# Patient Record
Sex: Female | Born: 1935 | Race: Black or African American | Hispanic: No
Health system: Southern US, Community
[De-identification: ages and names within clinical notes are randomized; demographics above are authoritative.]

## PROBLEM LIST (undated history)

## (undated) DIAGNOSIS — E119 Type 2 diabetes mellitus without complications: Secondary | ICD-10-CM

## (undated) DIAGNOSIS — I10 Essential (primary) hypertension: Secondary | ICD-10-CM

## (undated) HISTORY — PX: CARPAL TUNNEL RELEASE: SHX101

## (undated) HISTORY — PX: TRIGGER FINGER RELEASE: SHX641

## (undated) HISTORY — PX: ABDOMINAL HYSTERECTOMY: SHX81

## (undated) HISTORY — PX: JOINT REPLACEMENT: SHX530

## (undated) HISTORY — PX: APPENDECTOMY: SHX54

## (undated) HISTORY — PX: TONSILLECTOMY: SUR1361

---

## 2020-04-07 ENCOUNTER — Other Ambulatory Visit: Payer: Self-pay

## 2020-04-07 ENCOUNTER — Emergency Department (HOSPITAL_BASED_OUTPATIENT_CLINIC_OR_DEPARTMENT_OTHER)
Admission: EM | Admit: 2020-04-07 | Discharge: 2020-04-07 | Disposition: A | Discharge status: Home / Self Care | Payer: Medicare HMO | Attending: Emergency Medicine | Admitting: Emergency Medicine

## 2020-04-07 ENCOUNTER — Emergency Department (HOSPITAL_BASED_OUTPATIENT_CLINIC_OR_DEPARTMENT_OTHER): Payer: Medicare HMO

## 2020-04-07 ENCOUNTER — Encounter (HOSPITAL_BASED_OUTPATIENT_CLINIC_OR_DEPARTMENT_OTHER): Payer: Self-pay | Admitting: *Deleted

## 2020-04-07 DIAGNOSIS — E119 Type 2 diabetes mellitus without complications: Secondary | ICD-10-CM | POA: Diagnosis not present

## 2020-04-07 DIAGNOSIS — I10 Essential (primary) hypertension: Secondary | ICD-10-CM | POA: Insufficient documentation

## 2020-04-07 DIAGNOSIS — S0992XA Unspecified injury of nose, initial encounter: Secondary | ICD-10-CM | POA: Diagnosis present

## 2020-04-07 DIAGNOSIS — W101XXA Fall (on)(from) sidewalk curb, initial encounter: Secondary | ICD-10-CM | POA: Diagnosis not present

## 2020-04-07 DIAGNOSIS — W19XXXA Unspecified fall, initial encounter: Secondary | ICD-10-CM

## 2020-04-07 DIAGNOSIS — S022XXA Fracture of nasal bones, initial encounter for closed fracture: Secondary | ICD-10-CM | POA: Insufficient documentation

## 2020-04-07 HISTORY — DX: Type 2 diabetes mellitus without complications: E11.9

## 2020-04-07 HISTORY — DX: Essential (primary) hypertension: I10

## 2020-04-07 MED ORDER — BACITRACIN ZINC 500 UNIT/GM EX OINT: 1.0000 "application " | TOPICAL_OINTMENT | Freq: Two times a day (BID) | CUTANEOUS | 0 refills | Status: AC

## 2020-04-07 MED ORDER — HYDROCODONE-ACETAMINOPHEN 5-325 MG PO TABS: 1.0000 | ORAL_TABLET | ORAL | 0 refills | Status: AC | PRN

## 2020-04-07 MED ORDER — ACETAMINOPHEN 500 MG PO TABS: 1000.0000 mg | ORAL_TABLET | Freq: Once | ORAL | Status: AC

## 2020-04-07 MED ORDER — BACITRACIN ZINC 500 UNIT/GM EX OINT: TOPICAL_OINTMENT | Freq: Two times a day (BID) | CUTANEOUS | Status: DC

## 2020-04-07 MED ORDER — ACETAMINOPHEN 500 MG PO TABS
ORAL_TABLET | ORAL | Status: AC
  Administered 2020-04-07: 1000 mg via ORAL
  Filled 2020-04-07: qty 2

## 2020-04-07 NOTE — ED Notes (Signed)
ED Provider at bedside. 

## 2020-04-07 NOTE — Discharge Instructions (Signed)
Ice the face and knee  Place ointment to the abrasions  Follow up with ENT for the nasal fracture and with Orthopedist for the knee  Return for new or worsening symptoms

## 2020-04-07 NOTE — ED Notes (Signed)
bacitracin applied to all wounds 3 facial and one hand wound

## 2020-04-07 NOTE — ED Notes (Signed)
Off unit for CT scan.  Son at bedside.

## 2020-04-07 NOTE — ED Triage Notes (Signed)
C/o fall from standing x 3 hrs ago , sent here from [facility] for ct face and head, denies LOC

## 2020-04-07 NOTE — ED Provider Notes (Signed)
[facility] EMERGENCY DEPARTMENT Provider Note   CSN: [id number] Arrival date & time: 04/07/20  1754     History Chief Complaint  Patient presents with  . Fall    Maria Hutchinson is a 86 y.o. female with past medical history significant for hypertension, diabetes who presents for evaluation of mechanical fall.  Patient was walking when she tripped and fell over a curb.  She hit her face on the ground as well as her left knee.  She has been ambulatory since the incident.  Had a nosebleed from her right nares which has since stopped.  She denies any anticoagulation, LOC.  States she has generalized facial pain, primarily over her right forehead where she has a hematoma.  She denies any midline neck pain, back pain or pelvic pain.  She denies any lightheadedness, blurred vision, chest pain, shortness of breath abdominal pain, diarrhea, dysuria.  Does have anterior left knee pain with some soft tissue swelling.  Her last tetanus is less than 5 years ago.  Has not had any episodes of emesis.  No preceding sudden onset thunderclap headache, chest pain, shortness of breath, dizziness.  Denies aggravating relieving factors.  Was seen by urgent care sent here for CT imaging.  History obtained from patient, family room and past medical records.  No interpreter is used.   HPI     Past Medical History:  Diagnosis Date  . Diabetes mellitus without complication (HCC)   . Hypertension     There are no problems to display for this patient.   Past Surgical History:  Procedure Laterality Date  . ABDOMINAL HYSTERECTOMY    . APPENDECTOMY    . CARPAL TUNNEL RELEASE    . JOINT REPLACEMENT    . TONSILLECTOMY    . TRIGGER FINGER RELEASE       OB History   No obstetric history on file.     No family history on file.  Social History   Tobacco Use  . Smoking status: Not on file  Substance Use Topics  . Alcohol use: Not on file  . Drug use: Not on file    Home Medications Prior to  Admission medications   Medication Sig Start Date End Date Taking? Authorizing Provider  bacitracin ointment Apply 1 application topically 2 (two) times daily. 04/07/20   Opal Mckellips A, PA-C  HYDROcodone-acetaminophen (NORCO/VICODIN) 5-325 MG tablet Take 1 tablet by mouth every 4 (four) hours as needed. 04/07/20   Silvano Garofano A, PA-C    Allergies    Codeine, Ivp dye [iodinated diagnostic agents], and Shellfish allergy  Review of Systems   Review of Systems  Constitutional: Negative.   HENT: Negative.  Negative for facial swelling.   Respiratory: Negative.   Cardiovascular: Negative.   Gastrointestinal: Negative.   Genitourinary: Negative.   Musculoskeletal: Positive for neck pain. Negative for neck stiffness.       Left knee pain  Skin: Negative.   Neurological: Negative.   All other systems reviewed and are negative.   Physical Exam Updated Vital Signs BP (!) 161/93 (BP Location: Right Arm)   Pulse 60   Temp 97.7 F (36.5 C) (Oral)   Resp 19   Ht  (1.626 m)   Wt 78.5 kg   SpO2 99%   BMI 29.70 kg/m   Physical Exam Vitals and nursing note reviewed.  Constitutional:      General: She is not in acute distress.    Appearance: She is well-developed. She is  obese. She is not ill-appearing or toxic-appearing.  HENT:     Head: Normocephalic. Abrasion and contusion present. No raccoon eyes, Battle's sign or laceration.     Jaw: There is normal jaw occlusion.      Comments: Abrasion to distal nasal bridge as well as right upper lip.  No lacerations to suture.  Moderate hematoma to right forehead.  No battle sign, raccoon eyes. 4 finger jaw opening without pain.    Right Ear: No hemotympanum.     Left Ear: No hemotympanum.     Nose:     Right Nostril: Epistaxis present. No foreign body or septal hematoma.     Left Nostril: Epistaxis present. No foreign body or septal hematoma.      Comments: No septal hematoma, mild tenderness over nasal bridge.  Epistaxis with  dried blood to bilateral nares.  No active epistaxis    Mouth/Throat:     Lips: Pink.     Mouth: Mucous membranes are moist.     Dentition: No dental tenderness.     Pharynx: Oropharynx is clear. Uvula midline.     Comments: No drooling, dysphagia or trismus.  No loose dentition.  Uvula midline Eyes:     Extraocular Movements: Extraocular movements intact.     Conjunctiva/sclera: Conjunctivae normal.     Pupils: Pupils are equal, round, and reactive to light.     Visual Fields: Right eye visual fields normal and left eye visual fields normal.     Comments: EOMs intact.  PERRLA  Neck:     Trachea: Trachea and phonation normal.     Comments: Full range of motion without difficulty. Cardiovascular:     Rate and Rhythm: Normal rate.     Pulses: Normal pulses.          Radial pulses are 2+ on the right side and 2+ on the left side.       Dorsalis pedis pulses are 2+ on the right side and 2+ on the left side.     Heart sounds: Normal heart sounds.  Pulmonary:     Effort: Pulmonary effort is normal. No respiratory distress.     Breath sounds: Normal breath sounds and air entry.     Comments: Speaks in full sentences without difficulty Chest:     Comments: No crepitus, step-offs.  No tenderness to anterior posterior chest wall including ribs Abdominal:     General: Bowel sounds are normal. There is no distension.     Palpations: Abdomen is soft.     Tenderness: There is no abdominal tenderness. There is no right CVA tenderness, left CVA tenderness, guarding or rebound.     Hernia: No hernia is present.     Comments: Soft, nontender without rebound or guarding.  Pelvis stable, nontender palpation  Musculoskeletal:        General: Normal range of motion.     Cervical back: Full passive range of motion without pain and normal range of motion.     Comments: Pelvis stable, nontender palpation.  No midline spinal tenderness, crepitus or step-offs.  No bony tenderness to bilateral upper  extremities.  Raise his hand above head without difficulty.  Tenderness to anterior patella left lower extremity.  Able to flex and extend without difficulty.  Some mild soft tissue swelling.  Negative varus, valgus stress, negative anterior drawer.  No bony tenderness of bilateral femur, tibia or fibula.  Wiggles toes without difficulty.  Skin:    General: Skin is warm and dry.  Capillary Refill: Capillary refill takes less than 2 seconds.     Comments: Contusions and abrasions to face.  No lacerations to suture.  No active bleeding  Neurological:     General: No focal deficit present.     Mental Status: She is alert.     Cranial Nerves: Cranial nerves are intact.     Sensory: Sensation is intact.     Motor: Motor function is intact.     Coordination: Coordination is intact.     Gait: Gait is intact.     Comments: CN 2-12 grossly intact. Intact sensation Ambulatory without difficulty     ED Results / Procedures / Treatments   Labs (all labs ordered are listed, but only abnormal results are displayed) Labs Reviewed - No data to display  EKG None  Radiology CT Head Wo Contrast  Result Date: 04/07/2020 CLINICAL DATA:  Fall from standing height EXAM: CT HEAD WITHOUT CONTRAST CT MAXILLOFACIAL WITHOUT CONTRAST CT CERVICAL SPINE WITHOUT CONTRAST TECHNIQUE: Multidetector CT imaging of the head, cervical spine, and maxillofacial structures were performed using the standard protocol without intravenous contrast. Multiplanar CT image reconstructions of the cervical spine and maxillofacial structures were also generated. COMPARISON:  None FINDINGS: CT HEAD FINDINGS Brain: No evidence of acute infarction, hemorrhage, hydrocephalus, extra-axial collection, visible mass lesion or mass effect. Symmetric prominence of the ventricles, cisterns and sulci compatible with parenchymal volume loss. Patchy areas of white matter hypoattenuation are most compatible with chronic microvascular angiopathy.  Vascular: Atherosclerotic calcification of the carotid siphons. No hyperdense vessel. Skull: Right frontal scalp swelling and crescentic scalp hematoma measuring up to 6 mm in maximal thickness (3/27). No subjacent calvarial fracture or acute osseous injuries. Suspect a small tricholemmal cyst/dermal inclusion cyst in the high right posterior frontal scalp as well. Other: None. CT MAXILLOFACIAL FINDINGS Osseous: Soft tissue swelling extending across the bridge of the nose with impacted fractures of the bilateral nasal bones extending into the left frontal process of the maxilla and with associated fracture of the anterior bony nasal septum with slight leftward deviation. No fracture of the bony orbits. No other mid face fractures are seen. The pterygoid plates are intact. No visible or suspected temporal bone fractures. Temporomandibular joints are normally aligned. 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 Small radiodensities are seen anterior to the mandibular condyles which appears likely degenerative on the right though more indeterminate given a crescentic appearance on the left. Correlate for point tenderness. Mandible is otherwise intact. No fractured or avulsed teeth. Chronically absent maxillary and mandibular molar dentition bilaterally. Orbits: Soft tissue swelling predominantly across the medial orbital soft tissues extending from the nasal bridge and the right supraorbital soft tissues with some asymmetric, right greater than left palpebral thickening. No retro septal gas, stranding or hemorrhage. Prior bilateral lens extractions and senescent scleral plaques are noted. The globes appear otherwise normal and symmetric. Symmetric appearance of the extraocular musculature and optic nerve sheath complexes. Normal caliber of the superior ophthalmic veins. Sinuses: Thickening and pneumatized fluid in the anterior nasal passages likely some small amount of hemorrhage. Paranasal sinuses and mastoids are  otherwise predominantly clear. Degenerate bone Soft tissues: Soft tissue swelling across the frontal scalp, supraorbital and right superomedial soft tissues with additional soft tissue swelling superficial to the comminuted fractures of the nasal bridge and medial orbits bilaterally as well as bilaterally into the medial malar soft tissues. No other significant sites of swelling or hematoma. No soft tissue gas or foreign body. CT CERVICAL SPINE FINDINGS Alignment: Straightening  of the normal cervical lordosis with some focal reversal at C5-6 possibly accentuated by mild cervical flexion noted on the scout view. A stabilization collar is not present at the time of examination. No significant spondylolisthesis is evident. No evidence of traumatic listhesis. No abnormally widened, perched or jumped facets. Normal alignment of the craniocervical and atlantoaxial articulations. Skull base and vertebrae: The osseous structures appear diffusely demineralized which may limit detection of small or nondisplaced fractures. No acute skull base fracture. No vertebral body fracture or height loss. Moderate arthrosis at the atlantodental and basion dens intervals with periarticular spurring. Multilevel spondylitic changes as detailed below. Schmorl's node formation and C6 inferior endplate with vacuum phenomenon. No worrisome osseous lesions. Soft tissues and spinal canal: No pre or paravertebral fluid or swelling. No visible canal hematoma. Cervical carotid atherosclerosis. Disc levels: Multilevel intervertebral disc height loss with spondylitic endplate changes. Features most pronounced C5-6 and C6-7 with slightly larger disc osteophyte complexes at these levels which partially efface the ventral thecal sac but without significant resulting canal stenosis. Uncinate spurring and facet hypertrophic changes are present throughout the cervical spine resulting in mild multilevel neural foraminal narrowing with more moderate narrowing  bilaterally C5-6. No severe foraminal impingement. Upper chest: No acute abnormality in the upper chest or imaged lung apices. Calcification in the proximal great vessels and aortic arch. Other: No concerning thyroid nodules or masses. IMPRESSION: 1. No acute intracranial abnormality. 2. Right frontal scalp swelling and crescentic scalp hematoma measuring up to 6 mm in maximal thickness. No subjacent calvarial fracture or acute osseous injuries. 3. Mild parenchymal volume loss and chronic microvascular angiopathy. 4. Acute impacted fractures of the bilateral nasal bones extending into the left frontal process of the maxilla and with associated fracture of the anterior bony nasal septum with slight leftward deviation. Some associated soft tissue thickening and likely hemorrhage within the anterior nasal passages. 5. Periorbital soft tissue swelling bilaterally including more pronounced right supraorbital swelling with some asymmetric, right greater than left palpebral thickening. Additional thickening and palpebral soft tissues medially as well. No retro septal gas, stranding or hemorrhage. No associated fracture of the bony orbits. No globe injury. 6. Small radiodensities anterior to the mandibular condyles bilaterally which appear likely degenerative on the right though more indeterminate given a crescentic appearance on the left. Correlate for point tenderness. No TMJ dislocation or other mandibular fracture. 7. No evidence of acute fracture or traumatic listhesis of the cervical spine. 8. Cervical spondylitic changes as above. 9. Cervical and intracranial atherosclerosis. 10. Aortic Atherosclerosis (ICD10-I70.0). Electronically Signed   By: Kreg Shropshire M.D.   On: 04/07/2020 19:20   CT Cervical Spine Wo Contrast  Result Date: 04/07/2020 CLINICAL DATA:  Fall from standing height EXAM: CT HEAD WITHOUT CONTRAST CT MAXILLOFACIAL WITHOUT CONTRAST CT CERVICAL SPINE WITHOUT CONTRAST TECHNIQUE: Multidetector CT  imaging of the head, cervical spine, and maxillofacial structures were performed using the standard protocol without intravenous contrast. Multiplanar CT image reconstructions of the cervical spine and maxillofacial structures were also generated. COMPARISON:  None FINDINGS: CT HEAD FINDINGS Brain: No evidence of acute infarction, hemorrhage, hydrocephalus, extra-axial collection, visible mass lesion or mass effect. Symmetric prominence of the ventricles, cisterns and sulci compatible with parenchymal volume loss. Patchy areas of white matter hypoattenuation are most compatible with chronic microvascular angiopathy. Vascular: Atherosclerotic calcification of the carotid siphons. No hyperdense vessel. Skull: Right frontal scalp swelling and crescentic scalp hematoma measuring up to 6 mm in maximal thickness (3/27). No subjacent calvarial fracture or acute  osseous injuries. Suspect a small tricholemmal cyst/dermal inclusion cyst in the high right posterior frontal scalp as well. Other: None. CT MAXILLOFACIAL FINDINGS Osseous: Soft tissue swelling extending across the bridge of the nose with impacted fractures of the bilateral nasal bones extending into the left frontal process of the maxilla and with associated fracture of the anterior bony nasal septum with slight leftward deviation. No fracture of the bony orbits. No other mid face fractures are seen. The pterygoid plates are intact. No visible or suspected temporal bone fractures. Temporomandibular joints are normally aligned. 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 Small radiodensities are seen anterior to the mandibular condyles which appears likely degenerative on the right though more indeterminate given a crescentic appearance on the left. Correlate for point tenderness. Mandible is otherwise intact. No fractured or avulsed teeth. Chronically absent maxillary and mandibular molar dentition bilaterally. Orbits: Soft tissue swelling predominantly across the  medial orbital soft tissues extending from the nasal bridge and the right supraorbital soft tissues with some asymmetric, right greater than left palpebral thickening. No retro septal gas, stranding or hemorrhage. Prior bilateral lens extractions and senescent scleral plaques are noted. The globes appear otherwise normal and symmetric. Symmetric appearance of the extraocular musculature and optic nerve sheath complexes. Normal caliber of the superior ophthalmic veins. Sinuses: Thickening and pneumatized fluid in the anterior nasal passages likely some small amount of hemorrhage. Paranasal sinuses and mastoids are otherwise predominantly clear. Degenerate bone Soft tissues: Soft tissue swelling across the frontal scalp, supraorbital and right superomedial soft tissues with additional soft tissue swelling superficial to the comminuted fractures of the nasal bridge and medial orbits bilaterally as well as bilaterally into the medial malar soft tissues. No other significant sites of swelling or hematoma. No soft tissue gas or foreign body. CT CERVICAL SPINE FINDINGS Alignment: Straightening of the normal cervical lordosis with some focal reversal at C5-6 possibly accentuated by mild cervical flexion noted on the scout view. A stabilization collar is not present at the time of examination. No significant spondylolisthesis is evident. No evidence of traumatic listhesis. No abnormally widened, perched or jumped facets. Normal alignment of the craniocervical and atlantoaxial articulations. Skull base and vertebrae: The osseous structures appear diffusely demineralized which may limit detection of small or nondisplaced fractures. No acute skull base fracture. No vertebral body fracture or height loss. Moderate arthrosis at the atlantodental and basion dens intervals with periarticular spurring. Multilevel spondylitic changes as detailed below. Schmorl's node formation and C6 inferior endplate with vacuum phenomenon. No  worrisome osseous lesions. Soft tissues and spinal canal: No pre or paravertebral fluid or swelling. No visible canal hematoma. Cervical carotid atherosclerosis. Disc levels: Multilevel intervertebral disc height loss with spondylitic endplate changes. Features most pronounced C5-6 and C6-7 with slightly larger disc osteophyte complexes at these levels which partially efface the ventral thecal sac but without significant resulting canal stenosis. Uncinate spurring and facet hypertrophic changes are present throughout the cervical spine resulting in mild multilevel neural foraminal narrowing with more moderate narrowing bilaterally C5-6. No severe foraminal impingement. Upper chest: No acute abnormality in the upper chest or imaged lung apices. Calcification in the proximal great vessels and aortic arch. Other: No concerning thyroid nodules or masses. IMPRESSION: 1. No acute intracranial abnormality. 2. Right frontal scalp swelling and crescentic scalp hematoma measuring up to 6 mm in maximal thickness. No subjacent calvarial fracture or acute osseous injuries. 3. Mild parenchymal volume loss and chronic microvascular angiopathy. 4. Acute impacted fractures of the bilateral nasal bones extending into  the left frontal process of the maxilla and with associated fracture of the anterior bony nasal septum with slight leftward deviation. Some associated soft tissue thickening and likely hemorrhage within the anterior nasal passages. 5. Periorbital soft tissue swelling bilaterally including more pronounced right supraorbital swelling with some asymmetric, right greater than left palpebral thickening. Additional thickening and palpebral soft tissues medially as well. No retro septal gas, stranding or hemorrhage. No associated fracture of the bony orbits. No globe injury. 6. Small radiodensities anterior to the mandibular condyles bilaterally which appear likely degenerative on the right though more indeterminate given a  crescentic appearance on the left. Correlate for point tenderness. No TMJ dislocation or other mandibular fracture. 7. No evidence of acute fracture or traumatic listhesis of the cervical spine. 8. Cervical spondylitic changes as above. 9. Cervical and intracranial atherosclerosis. 10. Aortic Atherosclerosis (ICD10-I70.0). Electronically Signed   By: Kreg Shropshire M.D.   On: 04/07/2020 19:20   DG Knee Complete 4 Views Left  Result Date: 04/07/2020 CLINICAL DATA:  Status post fall. EXAM: LEFT KNEE - COMPLETE 4+ VIEW COMPARISON:  None. FINDINGS: A left knee replacement is seen without evidence of surrounding lucency to suggest the presence of hardware loosening or infection. No evidence of an acute fracture or dislocation. No evidence of arthropathy or other focal bone abnormality. A small joint effusion is seen. Marked severity anterior and medial soft tissue swelling is seen. IMPRESSION: 1. Intact left knee replacement. 2. Marked severity anterior and medial soft tissue swelling, without acute fracture. 3. Small joint effusion. Electronically Signed   By: Aram Candela M.D.   On: 04/07/2020 19:23   CT Maxillofacial Wo Contrast  Result Date: 04/07/2020 CLINICAL DATA:  Fall from standing height EXAM: CT HEAD WITHOUT CONTRAST CT MAXILLOFACIAL WITHOUT CONTRAST CT CERVICAL SPINE WITHOUT CONTRAST TECHNIQUE: Multidetector CT imaging of the head, cervical spine, and maxillofacial structures were performed using the standard protocol without intravenous contrast. Multiplanar CT image reconstructions of the cervical spine and maxillofacial structures were also generated. COMPARISON:  None FINDINGS: CT HEAD FINDINGS Brain: No evidence of acute infarction, hemorrhage, hydrocephalus, extra-axial collection, visible mass lesion or mass effect. Symmetric prominence of the ventricles, cisterns and sulci compatible with parenchymal volume loss. Patchy areas of white matter hypoattenuation are most compatible with chronic  microvascular angiopathy. Vascular: Atherosclerotic calcification of the carotid siphons. No hyperdense vessel. Skull: Right frontal scalp swelling and crescentic scalp hematoma measuring up to 6 mm in maximal thickness (3/27). No subjacent calvarial fracture or acute osseous injuries. Suspect a small tricholemmal cyst/dermal inclusion cyst in the high right posterior frontal scalp as well. Other: None. CT MAXILLOFACIAL FINDINGS Osseous: Soft tissue swelling extending across the bridge of the nose with impacted fractures of the bilateral nasal bones extending into the left frontal process of the maxilla and with associated fracture of the anterior bony nasal septum with slight leftward deviation. No fracture of the bony orbits. No other mid face fractures are seen. The pterygoid plates are intact. No visible or suspected temporal bone fractures. Temporomandibular joints are normally aligned. 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 Small radiodensities are seen anterior to the mandibular condyles which appears likely degenerative on the right though more indeterminate given a crescentic appearance on the left. Correlate for point tenderness. Mandible is otherwise intact. No fractured or avulsed teeth. Chronically absent maxillary and mandibular molar dentition bilaterally. Orbits: Soft tissue swelling predominantly across the medial orbital soft tissues extending from the nasal bridge and the right supraorbital soft tissues with some  asymmetric, right greater than left palpebral thickening. No retro septal gas, stranding or hemorrhage. Prior bilateral lens extractions and senescent scleral plaques are noted. The globes appear otherwise normal and symmetric. Symmetric appearance of the extraocular musculature and optic nerve sheath complexes. Normal caliber of the superior ophthalmic veins. Sinuses: Thickening and pneumatized fluid in the anterior nasal passages likely some small amount of hemorrhage. Paranasal  sinuses and mastoids are otherwise predominantly clear. Degenerate bone Soft tissues: Soft tissue swelling across the frontal scalp, supraorbital and right superomedial soft tissues with additional soft tissue swelling superficial to the comminuted fractures of the nasal bridge and medial orbits bilaterally as well as bilaterally into the medial malar soft tissues. No other significant sites of swelling or hematoma. No soft tissue gas or foreign body. CT CERVICAL SPINE FINDINGS Alignment: Straightening of the normal cervical lordosis with some focal reversal at C5-6 possibly accentuated by mild cervical flexion noted on the scout view. A stabilization collar is not present at the time of examination. No significant spondylolisthesis is evident. No evidence of traumatic listhesis. No abnormally widened, perched or jumped facets. Normal alignment of the craniocervical and atlantoaxial articulations. Skull base and vertebrae: The osseous structures appear diffusely demineralized which may limit detection of small or nondisplaced fractures. No acute skull base fracture. No vertebral body fracture or height loss. Moderate arthrosis at the atlantodental and basion dens intervals with periarticular spurring. Multilevel spondylitic changes as detailed below. Schmorl's node formation and C6 inferior endplate with vacuum phenomenon. No worrisome osseous lesions. Soft tissues and spinal canal: No pre or paravertebral fluid or swelling. No visible canal hematoma. Cervical carotid atherosclerosis. Disc levels: Multilevel intervertebral disc height loss with spondylitic endplate changes. Features most pronounced C5-6 and C6-7 with slightly larger disc osteophyte complexes at these levels which partially efface the ventral thecal sac but without significant resulting canal stenosis. Uncinate spurring and facet hypertrophic changes are present throughout the cervical spine resulting in mild multilevel neural foraminal narrowing  with more moderate narrowing bilaterally C5-6. No severe foraminal impingement. Upper chest: No acute abnormality in the upper chest or imaged lung apices. Calcification in the proximal great vessels and aortic arch. Other: No concerning thyroid nodules or masses. IMPRESSION: 1. No acute intracranial abnormality. 2. Right frontal scalp swelling and crescentic scalp hematoma measuring up to 6 mm in maximal thickness. No subjacent calvarial fracture or acute osseous injuries. 3. Mild parenchymal volume loss and chronic microvascular angiopathy. 4. Acute impacted fractures of the bilateral nasal bones extending into the left frontal process of the maxilla and with associated fracture of the anterior bony nasal septum with slight leftward deviation. Some associated soft tissue thickening and likely hemorrhage within the anterior nasal passages. 5. Periorbital soft tissue swelling bilaterally including more pronounced right supraorbital swelling with some asymmetric, right greater than left palpebral thickening. Additional thickening and palpebral soft tissues medially as well. No retro septal gas, stranding or hemorrhage. No associated fracture of the bony orbits. No globe injury. 6. Small radiodensities anterior to the mandibular condyles bilaterally which appear likely degenerative on the right though more indeterminate given a crescentic appearance on the left. Correlate for point tenderness. No TMJ dislocation or other mandibular fracture. 7. No evidence of acute fracture or traumatic listhesis of the cervical spine. 8. Cervical spondylitic changes as above. 9. Cervical and intracranial atherosclerosis. 10. Aortic Atherosclerosis (ICD10-I70.0). Electronically Signed   By: Kreg Shropshire M.D.   On: 04/07/2020 19:20    Procedures Procedures (including critical care time)  Medications  Ordered in ED Medications  bacitracin ointment (has no administration in time range)    ED Course  I have reviewed the triage  vital signs and the nursing notes.  Pertinent labs & imaging results that were available during my care of the patient were reviewed by me and considered in my medical decision making (see chart for details).  86 year old female presents for evaluation after mechanical fall which occurred just PTA.  She is afebrile, nonseptic, not ill-appearing.  No preceding symptoms prior to fall.  Patient with various contusions and abrasions to face.  Her EOMs are intact.  No pain with EOMs.  Low suspicion for orbital entrapment.  She has dried blood to bilateral nares consistent with prior epistaxis over no active bleed.  No septal hematoma.  Dentition is intact.  No evidence of intraoral trauma.  Her tongue is midline.  Does have moderate size hematoma to right forehead.  She is neurovascularly intact.  No midline spinal tenderness, crepitus.  She has some tenderness to her left anterior patella there is full range of motion, negative varus, valgus stress, negative anterior drawer.  No bony tenderness to pelvis, femur, tibia or fibula.  She was ambulatory prior to arrival.  No LOC or anticoagulation.  Plan on imaging and reassess. She does not want anything for pain at this time.  CT head without acute changes CT cervical without acute cervical changes CT max face comminuted, displaced nasal fractures. DG left knee stable hardware, no fracture dislocation. Soft tissue swelling  Patient reassessed.  Ambulatory without difficulty.  No active epistaxis on exam.  Discussed CT as well as x-ray imaging here in the emergency department.  Discussed follow-up with ear nose and throat emergency department as well as no blowing nose.  Ice and symptomatic management for her left knee pain however no acute fractures or dislocations.  She is ambulatory that difficulty.  Discussed return precautions with patient and family in room.  They are agreeable to this.  The patient has been appropriately medically screened and/or  stabilized in the ED. I have low suspicion for any other emergent medical condition which would require further screening, evaluation or treatment in the ED or require inpatient management.  Patient is hemodynamically stable and in no acute distress.  Patient able to ambulate in department prior to ED.  Evaluation does not show acute pathology that would require ongoing or additional emergent interventions while in the emergency department or further inpatient treatment.  I have discussed the diagnosis with the patient and answered all questions.  Pain is been managed while in the emergency department and patient has no further complaints prior to discharge.  Patient is comfortable with plan discussed in room and is stable for discharge at this time.  I have discussed strict return precautions for returning to the emergency department.  Patient was encouraged to follow-up with PCP/specialist refer to at discharge.  Clinical Course as of Apr 07 2030  Tue Apr 07, 2020  [id number] 86 year old female here for evaluation of injuries after a fall.  She is got some bruising to her head and tenderness through her face.  Getting CT head cervical spine and max face along with any x-ray.  Disposition per results of testing.   [MB]    Clinical Course User Index [MB] Terrilee FilesButler, Michael C, MD   MDM Rules/Calculators/A&P                           Final  Clinical Impression(s) / ED Diagnoses Final diagnoses:  Fall, initial encounter  Closed fracture of nasal bone, initial encounter    Rx / DC Orders ED Discharge Orders         Ordered    HYDROcodone-acetaminophen (NORCO/VICODIN) 5-325 MG tablet  Every 4 hours PRN        04/07/20 2006    bacitracin ointment  2 times daily        04/07/20 2006           Carys Malina A, PA-C 04/07/20 2031    Terrilee Files, MD 04/08/20 [phone]

## 2021-11-02 IMAGING — CT CT CERVICAL SPINE W/O CM
2 series · 9 of 27 positions shown, 11 images · non-contrast
Comparison: None

CLINICAL DATA: Fall from standing height

EXAM:
CT HEAD WITHOUT CONTRAST
CT MAXILLOFACIAL WITHOUT CONTRAST
CT CERVICAL SPINE WITHOUT CONTRAST
TECHNIQUE: Multidetector CT imaging of the head, cervical spine, and
maxillofacial structures were performed using the standard protocol
without intravenous contrast. Multiplanar CT image reconstructions
of the cervical spine and maxillofacial structures were also
generated.

[Series 3: c spine soft · axial · 0.33mm/px · z∈[+897,+1009]mm · 4 of 82 slices shown, 5 images]
[im 13/82  soft-tissue]
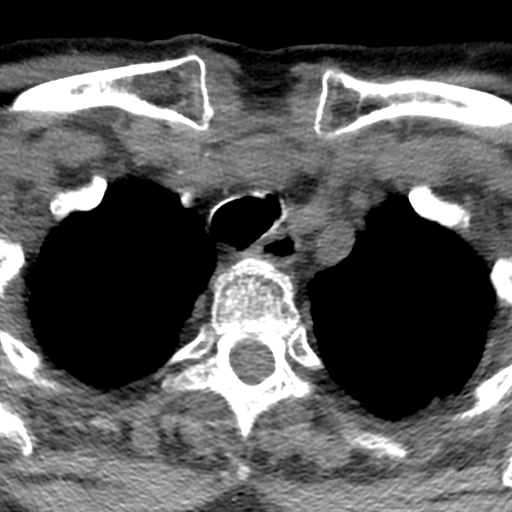
[im 13/82  bone]
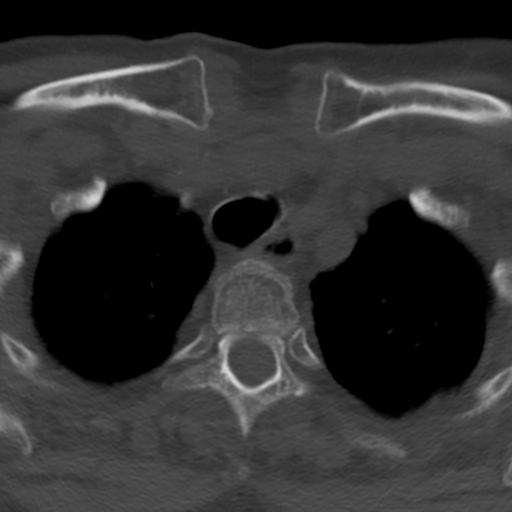
[im 32/82  bone]
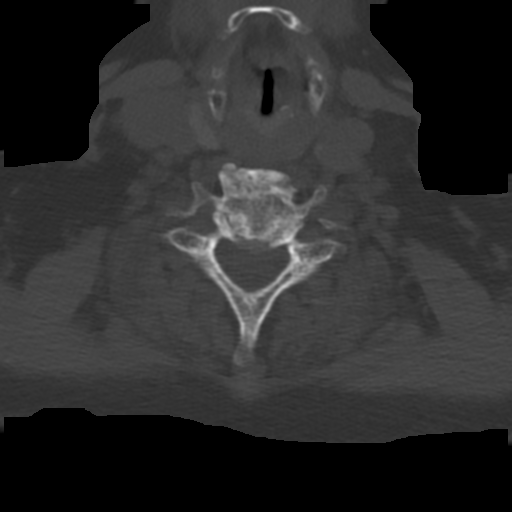
[im 50/82  bone]
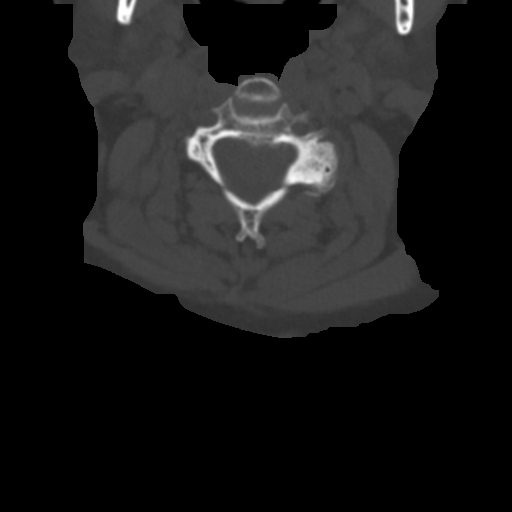
[im 69/82  bone]
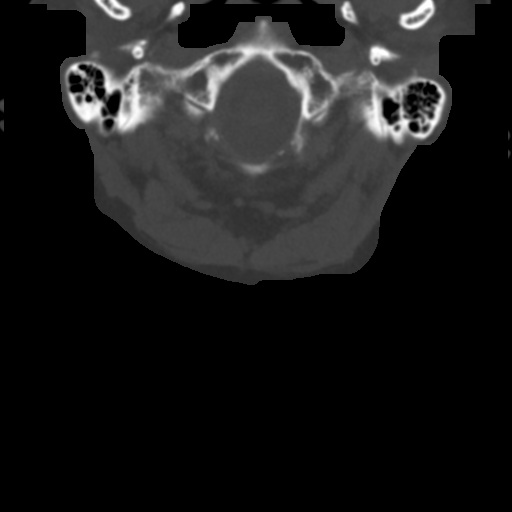

[Series 4: sagittal bone · sagittal · 0.23mm/px · 5 of 61 slices shown, 6 images]
[im 21/61  bone]
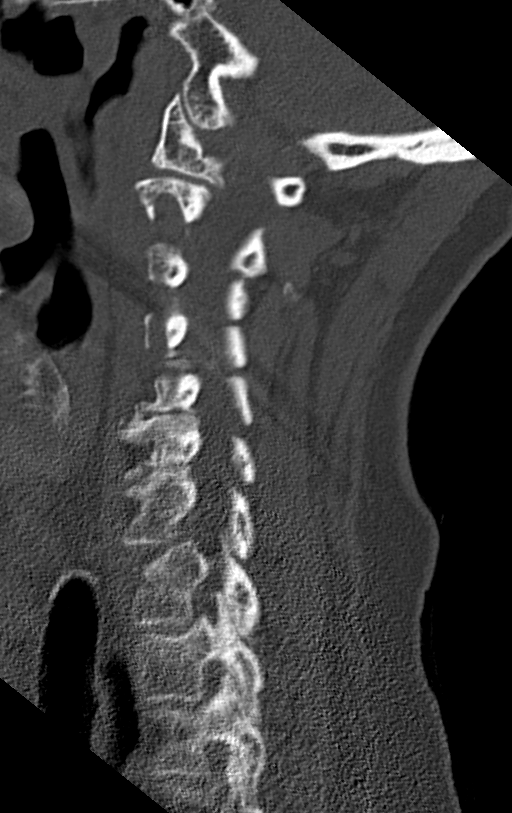
[im 26/61  bone]
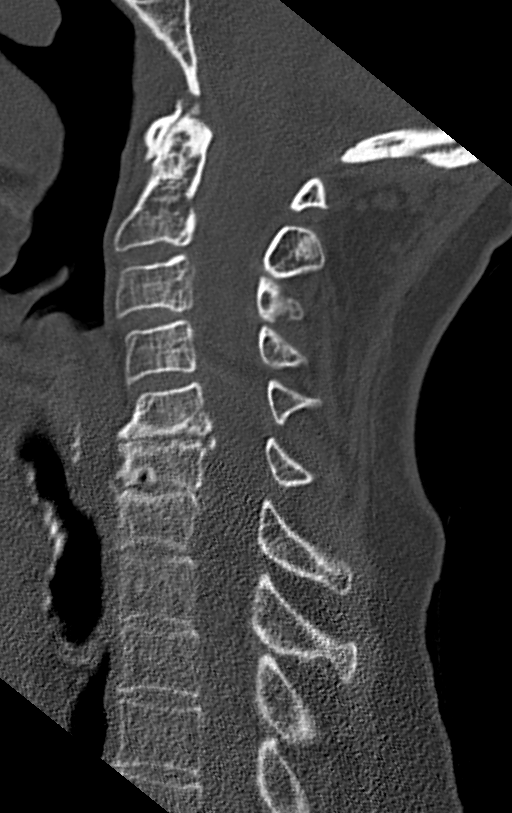
[im 31/61  soft-tissue]
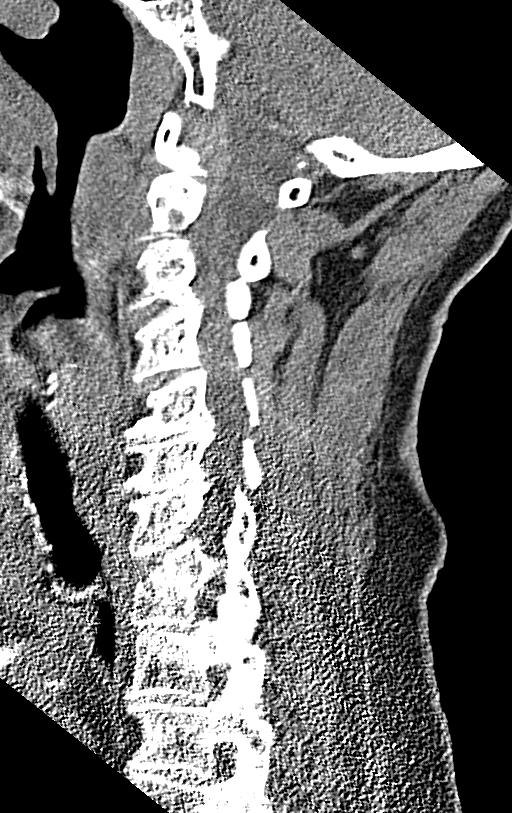
[im 31/61  bone]
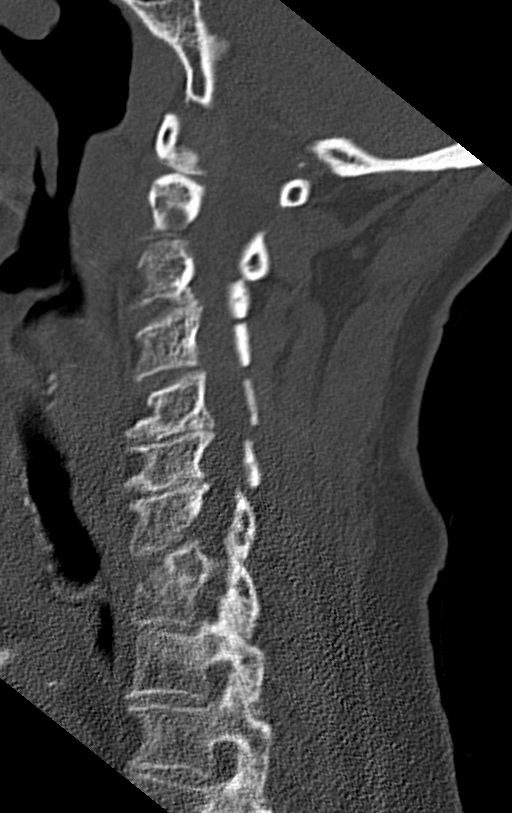
[im 36/61  bone]
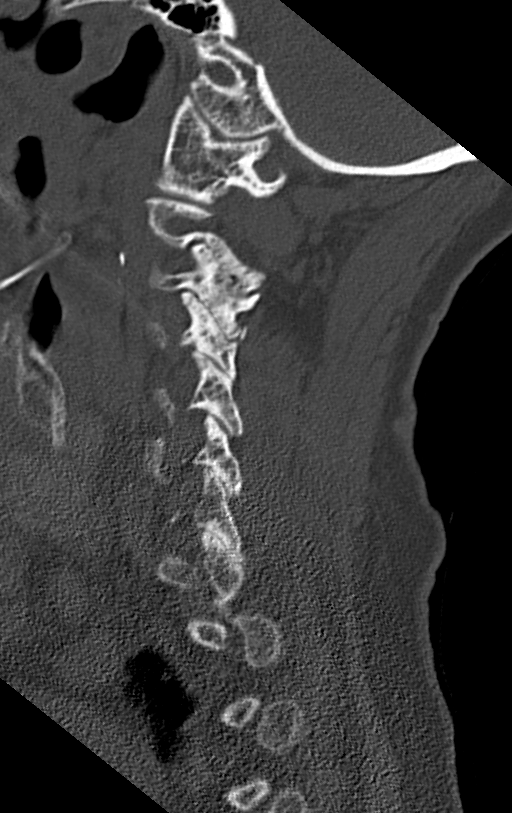
[im 41/61  bone]
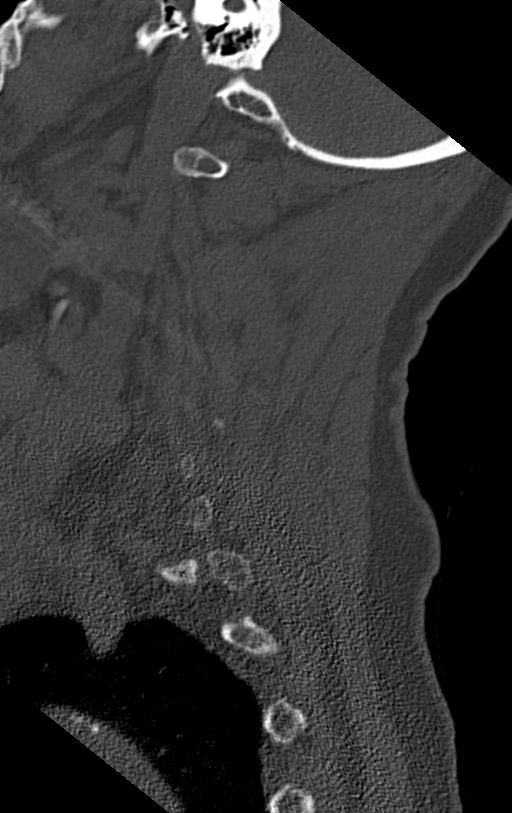

[9 of 27 positions shown; findings below may reference images not displayed]

FINDINGS: CT HEAD FINDINGS

Brain: No evidence of acute infarction, hemorrhage, hydrocephalus,
extra-axial collection, visible mass lesion or mass effect.
Symmetric prominence of the ventricles, cisterns and sulci
compatible with parenchymal volume loss. Patchy areas of white
matter hypoattenuation are most compatible with chronic
microvascular angiopathy.

Vascular: Atherosclerotic calcification of the carotid siphons. No
hyperdense vessel.

Skull: Right frontal scalp swelling and crescentic scalp hematoma
measuring up to 6 mm in maximal thickness ([DATE]). No subjacent
calvarial fracture or acute osseous injuries. Suspect a small
tricholemmal cyst/dermal inclusion cyst in the high right posterior
frontal scalp as well.

Other: None.

CT MAXILLOFACIAL FINDINGS

Osseous: Soft tissue swelling extending across the bridge of the
nose with impacted fractures of the bilateral nasal bones extending
into the left frontal process of the maxilla and with associated
fracture of the anterior bony nasal septum with slight leftward
deviation. No fracture of the bony orbits. No other mid face
fractures are seen. The pterygoid plates are intact. No visible or
suspected temporal bone fractures. Temporomandibular joints are
normally aligned. Bilateral TMJ arthrosis is mild-to-moderate. Small
radiodensities are seen anterior to the mandibular condyles which
appears likely degenerative on the right though more indeterminate
given a crescentic appearance on the left. Correlate for point
tenderness. Mandible is otherwise intact. No fractured or avulsed
teeth. Chronically absent maxillary and mandibular molar dentition
bilaterally.

Orbits: Soft tissue swelling predominantly across the medial orbital
soft tissues extending from the nasal bridge and the right
supraorbital soft tissues with some asymmetric, right greater than
left palpebral thickening. No retro septal gas, stranding or
hemorrhage. Prior bilateral lens extractions and senescent scleral
plaques are noted. The globes appear otherwise normal and symmetric.
Symmetric appearance of the extraocular musculature and optic nerve
sheath complexes. Normal caliber of the superior ophthalmic veins.

Sinuses: Thickening and pneumatized fluid in the anterior nasal
passages likely some small amount of hemorrhage. Paranasal sinuses
and mastoids are otherwise predominantly clear. Degenerate bone

Soft tissues: Soft tissue swelling across the frontal scalp,
supraorbital and right superomedial soft tissues with additional
soft tissue swelling superficial to the comminuted fractures of the
nasal bridge and medial orbits bilaterally as well as bilaterally
into the medial malar soft tissues. No other significant sites of
swelling or hematoma. No soft tissue gas or foreign body.

CT CERVICAL SPINE FINDINGS

Alignment: Straightening of the normal cervical lordosis with some
focal reversal at C5-6 possibly accentuated by mild cervical flexion
noted on the scout view. A stabilization collar is not present at
the time of examination. No significant spondylolisthesis is
evident. No evidence of traumatic listhesis. No abnormally widened,
perched or jumped facets. Normal alignment of the craniocervical and
atlantoaxial articulations.

Skull base and vertebrae: The osseous structures appear diffusely
demineralized which may limit detection of small or nondisplaced
fractures. No acute skull base fracture. No vertebral body fracture
or height loss. Moderate arthrosis at the atlantodental and basion
dens intervals with periarticular spurring. Multilevel spondylitic
changes as detailed below. Schmorl's node formation and C6 inferior
endplate with vacuum phenomenon. No worrisome osseous lesions.

Soft tissues and spinal canal: No pre or paravertebral fluid or
swelling. No visible canal hematoma. Cervical carotid
atherosclerosis.

Disc levels: Multilevel intervertebral disc height loss with
spondylitic endplate changes. Features most pronounced C5-6 and C6-7
with slightly larger disc osteophyte complexes at these levels which
partially efface the ventral thecal sac but without significant
resulting canal stenosis. Uncinate spurring and facet hypertrophic
changes are present throughout the cervical spine resulting in mild
multilevel neural foraminal narrowing with more moderate narrowing
bilaterally C5-6. No severe foraminal impingement.

Upper chest: No acute abnormality in the upper chest or imaged lung
apices. Calcification in the proximal great vessels and aortic arch.

Other: No concerning thyroid nodules or masses.
IMPRESSION: 1. No acute intracranial abnormality.
2. Right frontal scalp swelling and crescentic scalp hematoma
measuring up to 6 mm in maximal thickness. No subjacent calvarial
fracture or acute osseous injuries.
3. Mild parenchymal volume loss and chronic microvascular
angiopathy.
4. Acute impacted fractures of the bilateral nasal bones extending
into the left frontal process of the maxilla and with associated
fracture of the anterior bony nasal septum with slight leftward
deviation. Some associated soft tissue thickening and likely
hemorrhage within the anterior nasal passages.
5. Periorbital soft tissue swelling bilaterally including more
pronounced right supraorbital swelling with some asymmetric, right
greater than left palpebral thickening. Additional thickening and
palpebral soft tissues medially as well. No retro septal gas,
stranding or hemorrhage. No associated fracture of the bony orbits.
No globe injury.
6. Small radiodensities anterior to the mandibular condyles
bilaterally which appear likely degenerative on the right though
more indeterminate given a crescentic appearance on the left.
Correlate for point tenderness. No TMJ dislocation or other
mandibular fracture.
7. No evidence of acute fracture or traumatic listhesis of the
cervical spine.
8. Cervical spondylitic changes as above.
9. Cervical and intracranial atherosclerosis.
10. Aortic Atherosclerosis ([id number]).

## 2021-11-02 IMAGING — DX DG KNEE COMPLETE 4+V*L*
4 series · 4 of 4 positions shown · non-contrast
Comparison: None.

CLINICAL DATA: Status post fall.

EXAM:
LEFT KNEE - COMPLETE 4+ VIEW

[knee ap]
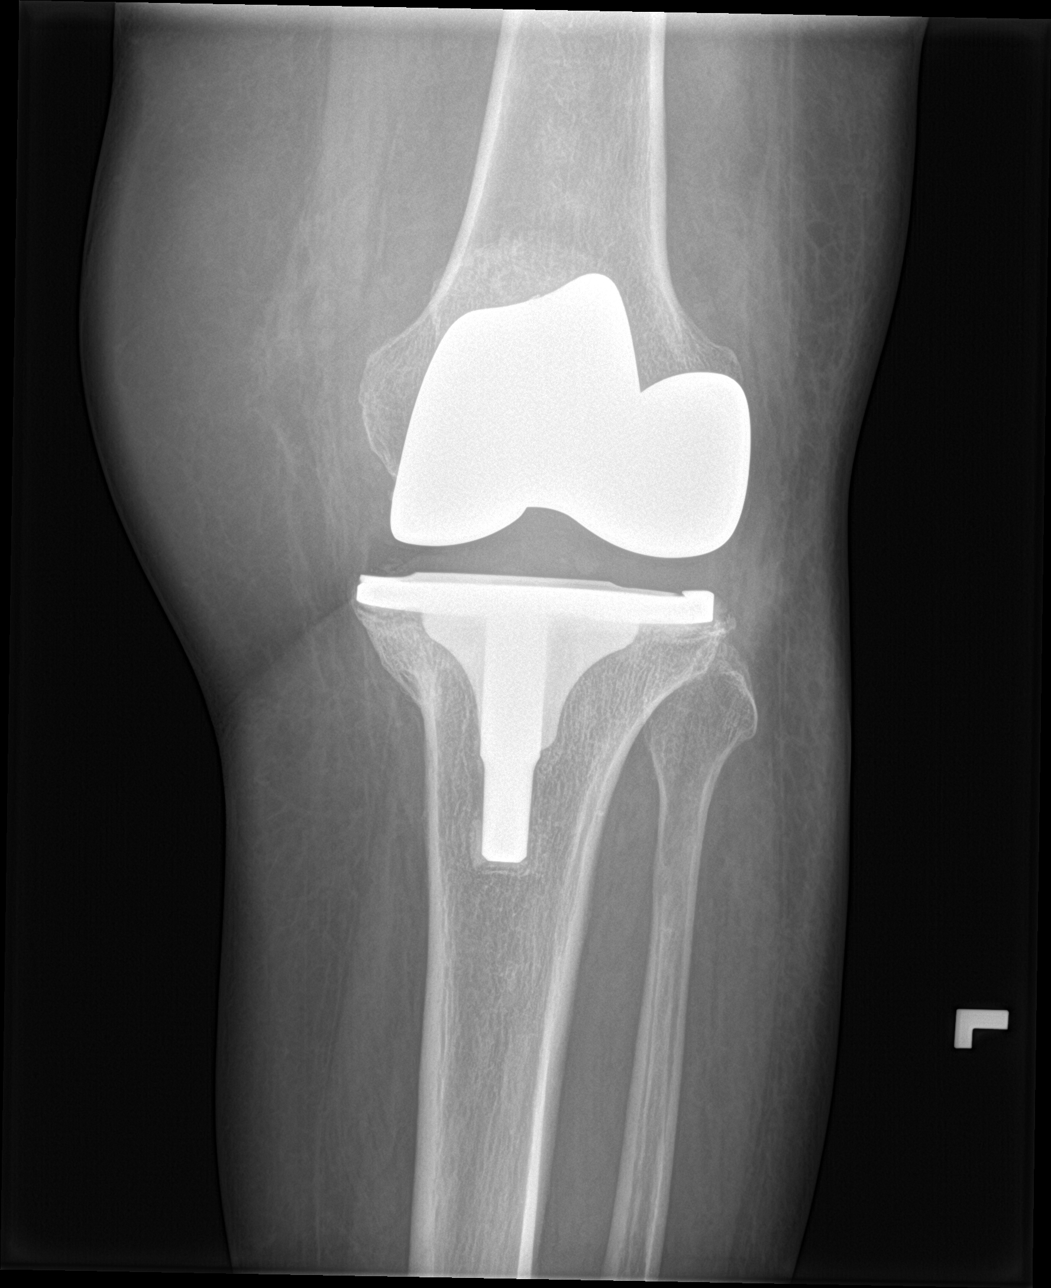

[knee obl (1 of 2)]
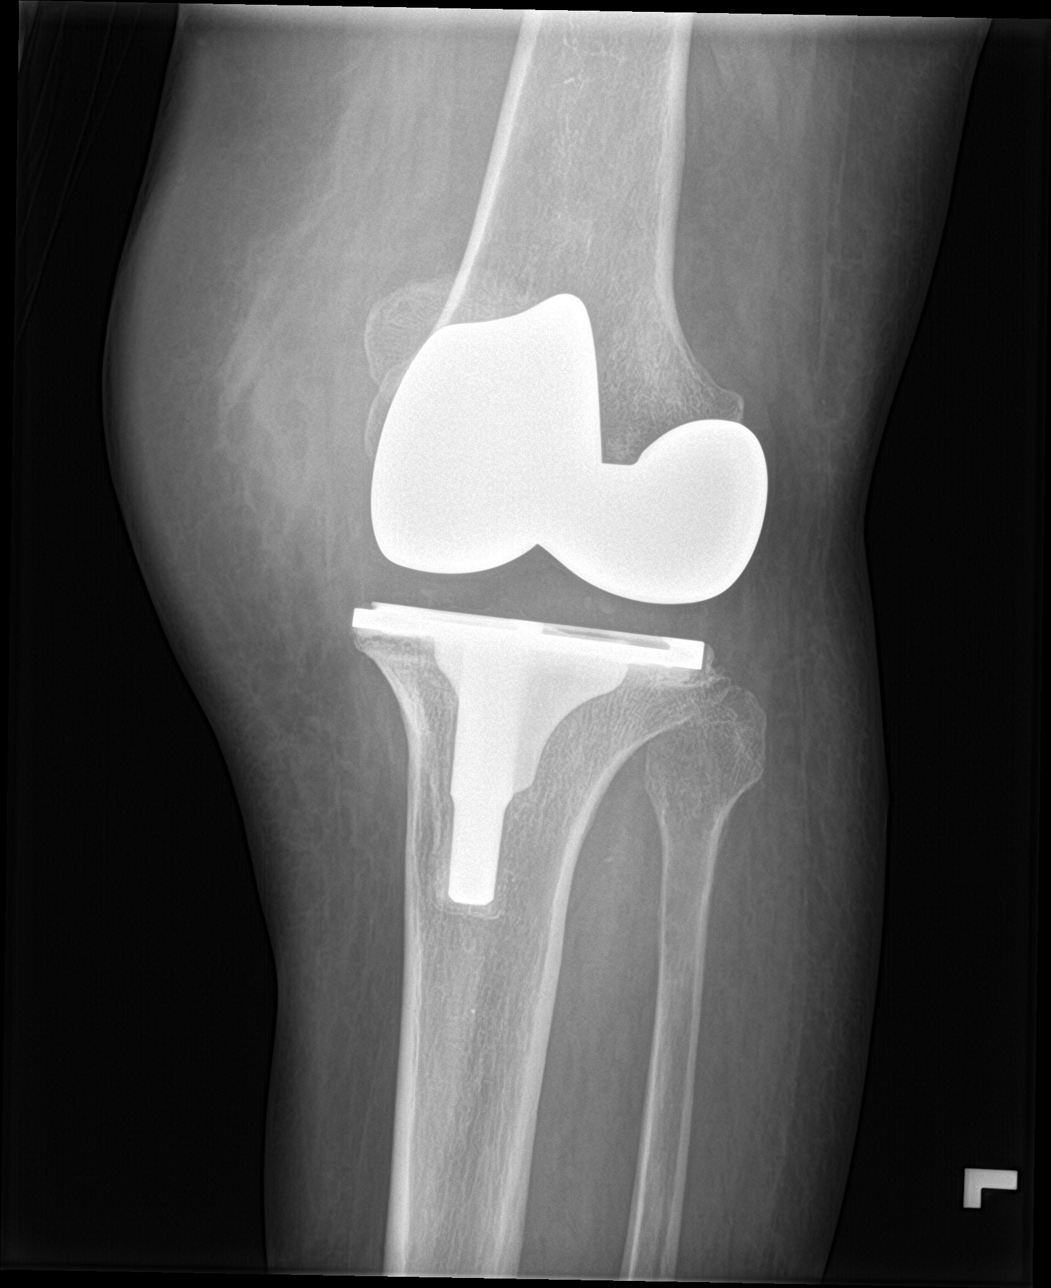

[knee obl (2 of 2)]
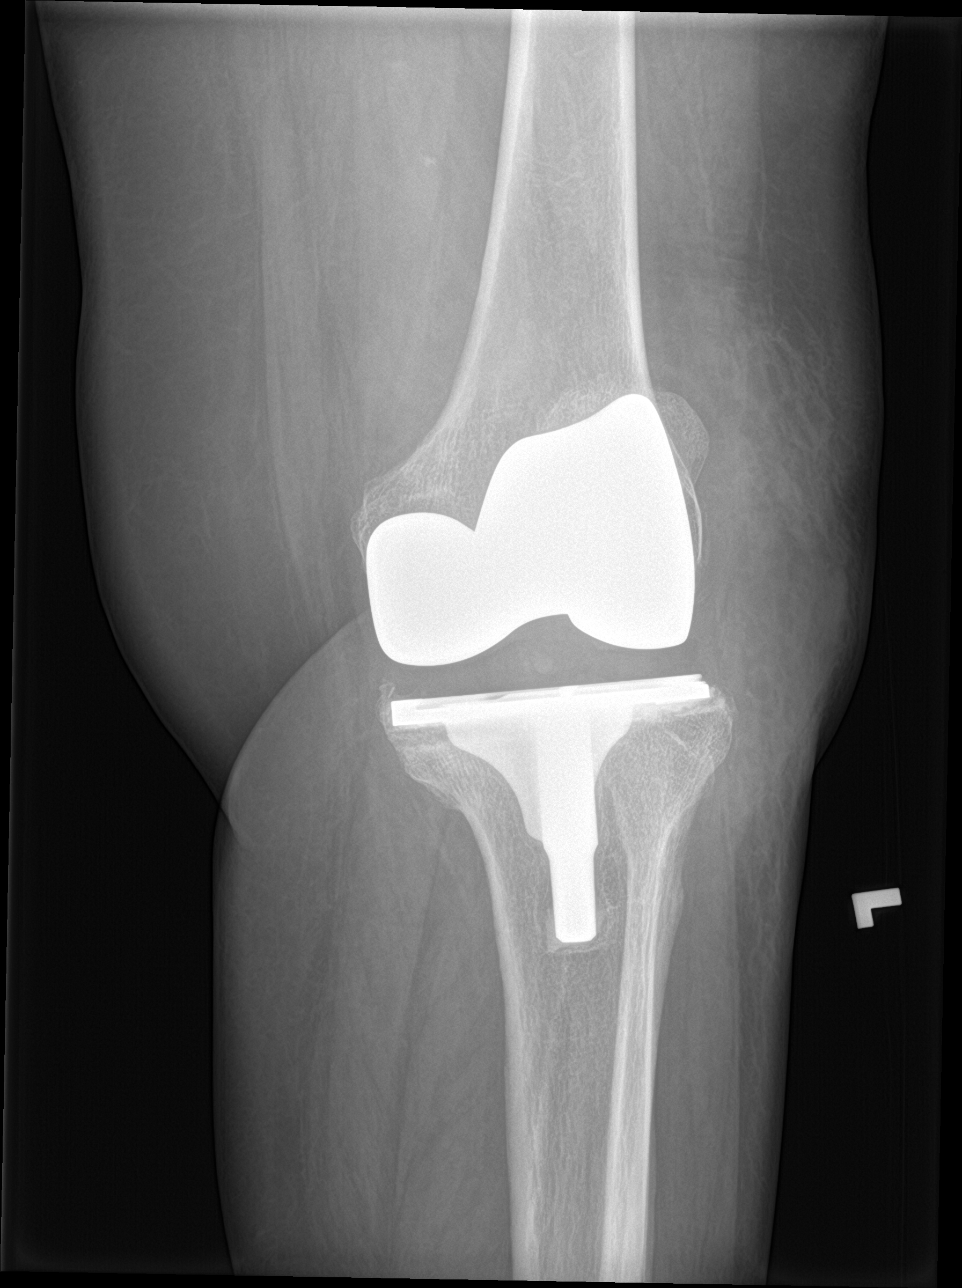

[knee lat]
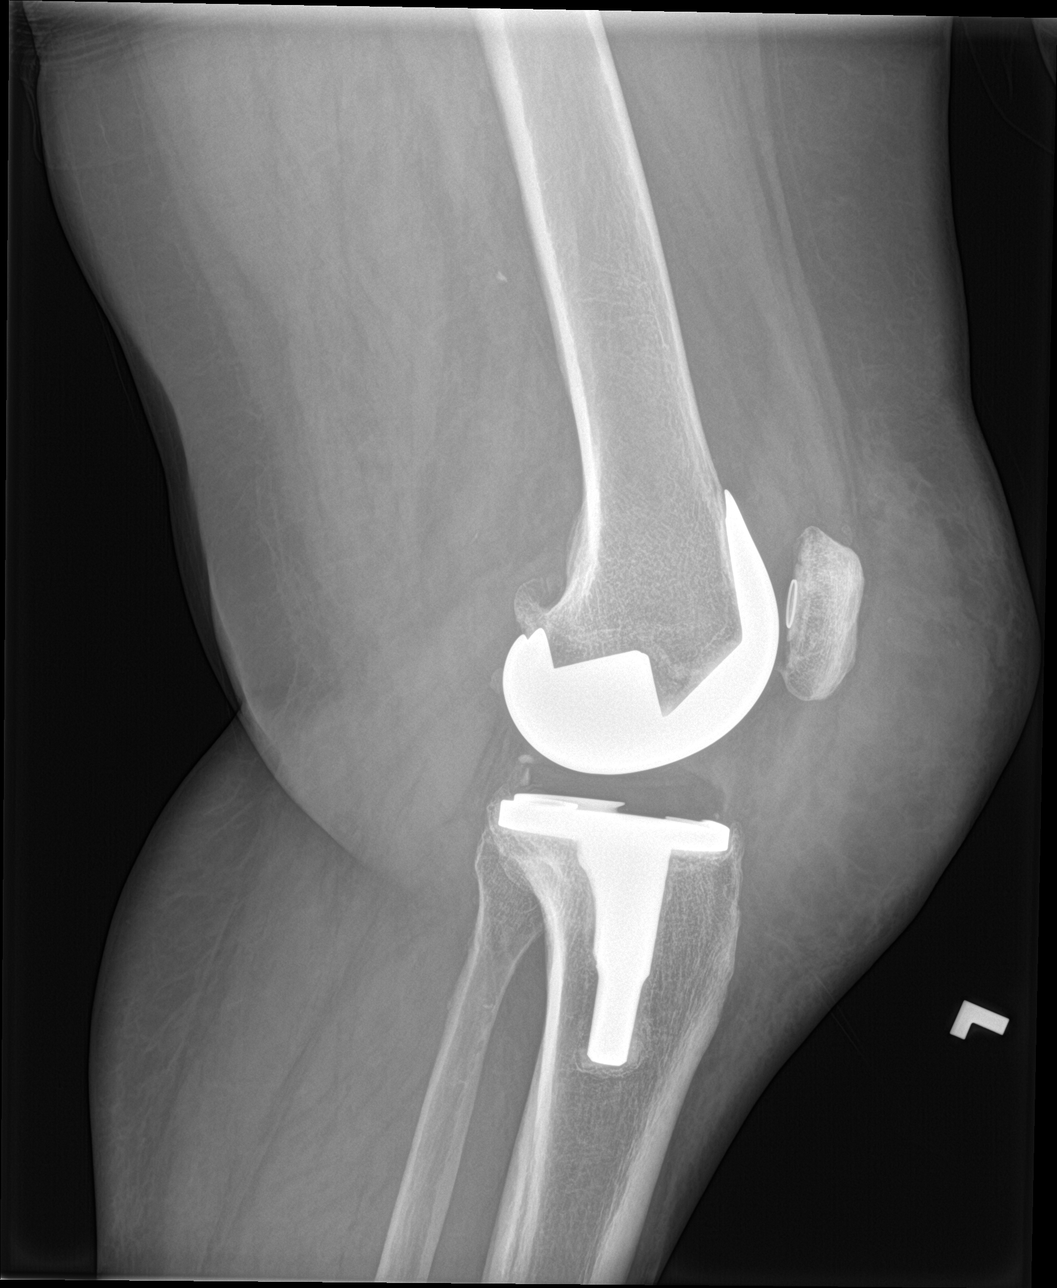

[4 of 4 positions shown; findings below may reference images not displayed]

FINDINGS: A left knee replacement is seen without evidence of surrounding
lucency to suggest the presence of hardware loosening or infection.
No evidence of an acute fracture or dislocation. No evidence of
arthropathy or other focal bone abnormality. A small joint effusion
is seen. Marked severity anterior and medial soft tissue swelling is
seen.
IMPRESSION: 1. Intact left knee replacement.
2. Marked severity anterior and medial soft tissue swelling, without
acute fracture.
3. Small joint effusion.
